# Patient Record
Sex: Male | Born: 1965 | Race: Black or African American | Marital: Married | Smoking: Former smoker
Health system: Southern US, Community
[De-identification: ages and names within clinical notes are randomized; demographics above are authoritative.]

## PROBLEM LIST (undated history)

## (undated) DIAGNOSIS — E119 Type 2 diabetes mellitus without complications: Secondary | ICD-10-CM

## (undated) DIAGNOSIS — E785 Hyperlipidemia, unspecified: Secondary | ICD-10-CM

## (undated) HISTORY — PX: CIRCUMCISION: SUR203

## (undated) HISTORY — PX: KIDNEY TRANSPLANT: SHX239

## (undated) HISTORY — DX: Hyperlipidemia, unspecified: E78.5

## (undated) HISTORY — DX: Type 2 diabetes mellitus without complications: E11.9

---

## 2017-12-31 DIAGNOSIS — E1165 Type 2 diabetes mellitus with hyperglycemia: Secondary | ICD-10-CM | POA: Diagnosis not present

## 2017-12-31 DIAGNOSIS — E1169 Type 2 diabetes mellitus with other specified complication: Secondary | ICD-10-CM | POA: Diagnosis not present

## 2018-01-15 DIAGNOSIS — E1169 Type 2 diabetes mellitus with other specified complication: Secondary | ICD-10-CM | POA: Diagnosis not present

## 2018-01-15 DIAGNOSIS — N529 Male erectile dysfunction, unspecified: Secondary | ICD-10-CM | POA: Diagnosis not present

## 2018-01-15 DIAGNOSIS — E785 Hyperlipidemia, unspecified: Secondary | ICD-10-CM | POA: Diagnosis not present

## 2018-01-15 DIAGNOSIS — E1165 Type 2 diabetes mellitus with hyperglycemia: Secondary | ICD-10-CM | POA: Diagnosis not present

## 2018-04-24 DIAGNOSIS — E1169 Type 2 diabetes mellitus with other specified complication: Secondary | ICD-10-CM | POA: Diagnosis not present

## 2018-04-24 DIAGNOSIS — E1165 Type 2 diabetes mellitus with hyperglycemia: Secondary | ICD-10-CM | POA: Diagnosis not present

## 2018-05-13 DIAGNOSIS — N182 Chronic kidney disease, stage 2 (mild): Secondary | ICD-10-CM | POA: Diagnosis not present

## 2018-05-13 DIAGNOSIS — E785 Hyperlipidemia, unspecified: Secondary | ICD-10-CM | POA: Diagnosis not present

## 2018-05-13 DIAGNOSIS — E1169 Type 2 diabetes mellitus with other specified complication: Secondary | ICD-10-CM | POA: Diagnosis not present

## 2018-05-13 DIAGNOSIS — Z125 Encounter for screening for malignant neoplasm of prostate: Secondary | ICD-10-CM | POA: Diagnosis not present

## 2018-05-13 DIAGNOSIS — E1165 Type 2 diabetes mellitus with hyperglycemia: Secondary | ICD-10-CM | POA: Diagnosis not present

## 2018-08-06 DIAGNOSIS — E1169 Type 2 diabetes mellitus with other specified complication: Secondary | ICD-10-CM | POA: Diagnosis not present

## 2018-08-06 DIAGNOSIS — E1165 Type 2 diabetes mellitus with hyperglycemia: Secondary | ICD-10-CM | POA: Diagnosis not present

## 2018-08-25 DIAGNOSIS — E1169 Type 2 diabetes mellitus with other specified complication: Secondary | ICD-10-CM | POA: Diagnosis not present

## 2018-08-25 DIAGNOSIS — Z6832 Body mass index (BMI) 32.0-32.9, adult: Secondary | ICD-10-CM | POA: Diagnosis not present

## 2018-08-25 DIAGNOSIS — E1165 Type 2 diabetes mellitus with hyperglycemia: Secondary | ICD-10-CM | POA: Diagnosis not present

## 2018-08-25 DIAGNOSIS — E785 Hyperlipidemia, unspecified: Secondary | ICD-10-CM | POA: Diagnosis not present

## 2018-11-21 DIAGNOSIS — E1165 Type 2 diabetes mellitus with hyperglycemia: Secondary | ICD-10-CM | POA: Diagnosis not present

## 2018-11-21 DIAGNOSIS — E1169 Type 2 diabetes mellitus with other specified complication: Secondary | ICD-10-CM | POA: Diagnosis not present

## 2018-11-26 DIAGNOSIS — E1165 Type 2 diabetes mellitus with hyperglycemia: Secondary | ICD-10-CM | POA: Diagnosis not present

## 2018-11-26 DIAGNOSIS — E1122 Type 2 diabetes mellitus with diabetic chronic kidney disease: Secondary | ICD-10-CM | POA: Diagnosis not present

## 2018-11-26 DIAGNOSIS — E785 Hyperlipidemia, unspecified: Secondary | ICD-10-CM | POA: Diagnosis not present

## 2018-11-26 DIAGNOSIS — E1169 Type 2 diabetes mellitus with other specified complication: Secondary | ICD-10-CM | POA: Diagnosis not present

## 2019-01-22 DIAGNOSIS — R531 Weakness: Secondary | ICD-10-CM | POA: Diagnosis not present

## 2019-01-22 DIAGNOSIS — W57XXXA Bitten or stung by nonvenomous insect and other nonvenomous arthropods, initial encounter: Secondary | ICD-10-CM | POA: Diagnosis not present

## 2019-01-22 DIAGNOSIS — Z7189 Other specified counseling: Secondary | ICD-10-CM | POA: Diagnosis not present

## 2019-03-12 DIAGNOSIS — R51 Headache: Secondary | ICD-10-CM | POA: Diagnosis not present

## 2019-03-12 DIAGNOSIS — Z20828 Contact with and (suspected) exposure to other viral communicable diseases: Secondary | ICD-10-CM | POA: Diagnosis not present

## 2019-03-12 DIAGNOSIS — R1084 Generalized abdominal pain: Secondary | ICD-10-CM | POA: Diagnosis not present

## 2019-03-23 DIAGNOSIS — Z125 Encounter for screening for malignant neoplasm of prostate: Secondary | ICD-10-CM | POA: Diagnosis not present

## 2019-03-23 DIAGNOSIS — E1169 Type 2 diabetes mellitus with other specified complication: Secondary | ICD-10-CM | POA: Diagnosis not present

## 2019-03-23 DIAGNOSIS — E1165 Type 2 diabetes mellitus with hyperglycemia: Secondary | ICD-10-CM | POA: Diagnosis not present

## 2019-04-01 DIAGNOSIS — E785 Hyperlipidemia, unspecified: Secondary | ICD-10-CM | POA: Diagnosis not present

## 2019-04-01 DIAGNOSIS — E1169 Type 2 diabetes mellitus with other specified complication: Secondary | ICD-10-CM | POA: Diagnosis not present

## 2019-04-01 DIAGNOSIS — E1122 Type 2 diabetes mellitus with diabetic chronic kidney disease: Secondary | ICD-10-CM | POA: Diagnosis not present

## 2019-04-01 DIAGNOSIS — E1129 Type 2 diabetes mellitus with other diabetic kidney complication: Secondary | ICD-10-CM | POA: Diagnosis not present

## 2019-07-25 DIAGNOSIS — R0981 Nasal congestion: Secondary | ICD-10-CM | POA: Diagnosis not present

## 2019-07-25 DIAGNOSIS — Z20828 Contact with and (suspected) exposure to other viral communicable diseases: Secondary | ICD-10-CM | POA: Diagnosis not present

## 2019-07-28 DIAGNOSIS — Z20828 Contact with and (suspected) exposure to other viral communicable diseases: Secondary | ICD-10-CM | POA: Diagnosis not present

## 2019-10-22 ENCOUNTER — Encounter: Payer: Self-pay | Admitting: Gastroenterology

## 2019-11-11 ENCOUNTER — Telehealth: Payer: Self-pay

## 2019-11-11 NOTE — Telephone Encounter (Signed)
Attempted to reach pt to confirm if he assess if pt has been seen by Dr. Lyndel Safe previously.    Unable to leave message on mobile-no other contacts.  Will need to assess prior or during PV of status.

## 2019-11-16 ENCOUNTER — Other Ambulatory Visit: Payer: Self-pay

## 2019-11-16 ENCOUNTER — Ambulatory Visit (AMBULATORY_SURGERY_CENTER): Payer: Self-pay

## 2019-11-16 DIAGNOSIS — Z01818 Encounter for other preprocedural examination: Secondary | ICD-10-CM

## 2019-11-16 DIAGNOSIS — Z1211 Encounter for screening for malignant neoplasm of colon: Secondary | ICD-10-CM

## 2019-11-16 NOTE — Progress Notes (Signed)
Patient's pre-visit was done today over the phone with the patient due to COVID-19 pandemic.   Name,DOB and address verified. Insurance verified. Packet of Prep instructions mailed to patient including copy of a consent form and pre-procedure patient acknowledgement form-pt is aware.   Patient understands to call us back with any questions or concerns. COVID-19 screening test is on 11/26/19 at 8:20, the patient is aware.  Pt is aware that care partner will wait in the car during procedure; if they feel like they will be too hot or cold to wait in the car; they may wait in the 4 th floor lobby.   Patient is aware to bring only one care partner. We want them to wear a mask (we do not have any that we can provide them), practice social distancing, and we will check their temperatures when they get here.  I did remind the patient that their care partner needs to stay in the parking lot the entire time and have a cell phone available, we will call them when the pt is ready for discharge. Patient will wear mask into building.

## 2019-11-26 ENCOUNTER — Other Ambulatory Visit: Payer: Self-pay | Admitting: Gastroenterology

## 2019-11-26 ENCOUNTER — Encounter: Payer: Self-pay | Admitting: Gastroenterology

## 2019-11-26 ENCOUNTER — Ambulatory Visit (INDEPENDENT_AMBULATORY_CARE_PROVIDER_SITE_OTHER): Payer: 59

## 2019-11-26 DIAGNOSIS — Z1159 Encounter for screening for other viral diseases: Secondary | ICD-10-CM

## 2019-11-27 LAB — SARS CORONAVIRUS 2 (TAT 6-24 HRS): SARS Coronavirus 2: NEGATIVE

## 2019-11-30 ENCOUNTER — Ambulatory Visit (AMBULATORY_SURGERY_CENTER): Payer: 59 | Admitting: Gastroenterology

## 2019-11-30 ENCOUNTER — Other Ambulatory Visit: Payer: Self-pay

## 2019-11-30 ENCOUNTER — Encounter: Payer: Self-pay | Admitting: Gastroenterology

## 2019-11-30 VITALS — BP 122/85 | HR 77 | Temp 96.8°F | Resp 17 | Ht 72.0 in | Wt 215.0 lb

## 2019-11-30 DIAGNOSIS — D122 Benign neoplasm of ascending colon: Secondary | ICD-10-CM

## 2019-11-30 DIAGNOSIS — Z1211 Encounter for screening for malignant neoplasm of colon: Secondary | ICD-10-CM

## 2019-11-30 MED ORDER — SODIUM CHLORIDE 0.9 % IV SOLN: 500.0000 mL | Freq: Once | INTRAVENOUS | Status: DC

## 2019-11-30 NOTE — Progress Notes (Signed)
Vs by KA. Temp by Hiram Comber

## 2019-11-30 NOTE — Patient Instructions (Signed)
HANDOUTS PROVIDED ON: POLYPS  The polyp removed today have been sent for pathology.  The results can take 1-3 weeks to receive.  When your next colonoscopy should occur will be based on the pathology results.    You may resume your previous diet and medication schedule.  Thank you for allowing us to care for you today!!!   YOU HAD AN ENDOSCOPIC PROCEDURE TODAY AT THE [facility]:   Refer to the procedure report that was given to you for any specific questions about what was found during the examination.  If the procedure report does not answer your questions, please call your gastroenterologist to clarify.  If you requested that your care partner not be given the details of your procedure findings, then the procedure report has been included in a sealed envelope for you to review at your convenience later.  YOU SHOULD EXPECT: Some feelings of bloating in the abdomen. Passage of more gas than usual.  Walking can help get rid of the air that was put into your GI tract during the procedure and reduce the bloating. If you had a lower endoscopy (such as a colonoscopy or flexible sigmoidoscopy) you may notice spotting of blood in your stool or on the toilet paper. If you underwent a bowel prep for your procedure, you may not have a normal bowel movement for a few days.  Please Note:  You might notice some irritation and congestion in your nose or some drainage.  This is from the oxygen used during your procedure.  There is no need for concern and it should clear up in a day or so.  SYMPTOMS TO REPORT IMMEDIATELY:   Following lower endoscopy (colonoscopy or flexible sigmoidoscopy):  Excessive amounts of blood in the stool  Significant tenderness or worsening of abdominal pains  Swelling of the abdomen that is new, acute  Fever of 100F or higher  For urgent or emergent issues, a gastroenterologist can be reached at any hour by calling [phone]. Do not use MyChart messaging for  urgent concerns.    DIET:  We do recommend a small meal at first, but then you may proceed to your regular diet.  Drink plenty of fluids but you should avoid alcoholic beverages for 24 hours.  ACTIVITY:  You should plan to take it easy for the rest of today and you should NOT DRIVE or use heavy machinery until tomorrow (because of the sedation medicines used during the test).    FOLLOW UP: Our staff will call the number listed on your records 48-72 hours following your procedure to check on you and address any questions or concerns that you may have regarding the information given to you following your procedure. If we do not reach you, we will leave a message.  We will attempt to reach you two times.  During this call, we will ask if you have developed any symptoms of COVID 19. If you develop any symptoms (ie: fever, flu-like symptoms, shortness of breath, cough etc.) before then, please call [phone].  If you test positive for Covid 19 in the 2 weeks post procedure, please call and report this information to us.    If any biopsies were taken you will be contacted by phone or by letter within the next 1-3 weeks.  Please call us at [phone] if you have not heard about the biopsies in 3 weeks.    SIGNATURES/CONFIDENTIALITY: You and/or your care partner have signed paperwork which will be entered into   your electronic medical record.  These signatures attest to the fact that that the information above on your After Visit Summary has been reviewed and is understood.  Full responsibility of the confidentiality of this discharge information lies with you and/or your care-partner.

## 2019-11-30 NOTE — Op Note (Signed)
[facility] Patient Name: Jonathan Spears Procedure Date: 11/30/2019 8:27 AM MRN: [id number] Endoscopist: Mallie Mussel L. Loletha Carrow , MD Age: 54 Referring MD:  Date of Birth: 16-Sep-1965 Gender: Male Account #: [id number] Procedure:                Colonoscopy Indications:              Screening for colorectal malignant neoplasm, This                            is the patient's first colonoscopy Medicines:                Monitored Anesthesia Care Procedure:                Pre-Anesthesia Assessment:                           - Prior to the procedure, a History and Physical                            was performed, and patient medications and                            allergies were reviewed. The patient's tolerance of                            previous anesthesia was also reviewed. The risks                            and benefits of the procedure and the sedation                            options and risks were discussed with the patient.                            All questions were answered, and informed consent                            was obtained. Prior Anticoagulants: The patient has                            taken no previous anticoagulant or antiplatelet                            agents except for aspirin. ASA Grade Assessment: II                            - A patient with mild systemic disease. After                            reviewing the risks and benefits, the patient was                            deemed in satisfactory condition to undergo the  procedure.                           After obtaining informed consent, the colonoscope                            was passed under direct vision. Throughout the                            procedure, the patient's blood pressure, pulse, and                            oxygen saturations were monitored continuously. The                            Colonoscope was introduced through the anus and                       advanced to the the cecum, identified by                            appendiceal orifice and ileocecal valve. The                            colonoscopy was performed without difficulty. The                            patient tolerated the procedure well. The quality                            of the bowel preparation was initially fair, then                            improved to good with extensive lavage. The                            ileocecal valve, appendiceal orifice, and rectum                            were photographed. The bowel preparation used was                            Miralax. Scope In: 8:36:56 AM Scope Out: 9:00:09 AM Scope Withdrawal Time: 0 hours 15 minutes 53 seconds  Total Procedure Duration: 0 hours 23 minutes 13 seconds  Findings:                 The perianal and digital rectal examinations were                            normal.                           A 4 mm polyp was found in the ascending colon. The  polyp was sessile. The polyp was removed with a                            cold snare. Resection and retrieval were complete.                            To stop active bleeding, one hemostatic clip was                            successfully placed. There was no bleeding at the                            end of the procedure.                           The exam was otherwise without abnormality on                            direct and retroflexion views. Complications:            No immediate complications. Estimated Blood Loss:     Estimated blood loss was minimal. Impression:               - One 4 mm polyp in the ascending colon, removed                            with a cold snare. Resected and retrieved. Clip was                            placed.                           - The examination was otherwise normal on direct                            and retroflexion views. Recommendation:           - Patient has  a contact number available for                            emergencies. The signs and symptoms of potential                            delayed complications were discussed with the                            patient. Return to normal activities tomorrow.                            Written discharge instructions were provided to the                            patient.                           - Resume previous diet.                           -  Continue present medications.                           - Repeat colonoscopy in 3 years for surveillance                            due to prep quality. (Suprep or Plenvu for next                            exam) Carlin Attridge L. Loletha Carrow, MD 11/30/2019 9:05:06 AM This report has been signed electronically.

## 2019-11-30 NOTE — Progress Notes (Signed)
To PACU, VSS. Report to [email or web address] 

## 2019-11-30 NOTE — Progress Notes (Signed)
Called to room to assist during endoscopic procedure.  Patient ID and intended procedure confirmed with present staff. Received instructions for my participation in the procedure from the performing physician.  

## 2019-12-02 ENCOUNTER — Telehealth: Payer: Self-pay | Admitting: *Deleted

## 2019-12-02 NOTE — Telephone Encounter (Signed)
1. Have you developed a fever since your procedure? no  2.   Have you had an respiratory symptoms (SOB or cough) since your procedure? no  3.   Have you tested positive for COVID 19 since your procedure no  4.   Have you had any family members/close contacts diagnosed with the COVID 19 since your procedure?  no   If yes to any of these questions please route to Joylene John, RN and Erenest Rasher, RN Follow up Call-  Call back number 11/30/2019  Post procedure Call Back phone  # [phone]  Permission to leave phone message Yes     Patient questions:  Do you have a fever, pain , or abdominal swelling? No. Pain Score  0 *  Have you tolerated food without any problems? Yes.    Have you been able to return to your normal activities? Yes.    Do you have any questions about your discharge instructions: Diet   No. Medications  No. Follow up visit  no  Do you have questions or concerns about your Care? No.  Actions: * If pain score is 4 or above: No action needed, pain <4.

## 2019-12-02 NOTE — Telephone Encounter (Signed)
  Follow up Call-  Call back number 11/30/2019  Post procedure Call Back phone  # [phone]  Permission to leave phone message Yes     Patient questions:  Do you have a fever, pain , or abdominal swelling? No. Pain Score  0 *  Have you tolerated food without any problems? Yes.    Have you been able to return to your normal activities? Yes.    Do you have any questions about your discharge instructions: Diet   No. Medications  No. Follow up visit  No.  Do you have questions or concerns about your Care? No.  Actions: * If pain score is 4 or above: No action needed, pain <4.  1. Have you developed a fever since your procedure? no  2.   Have you had an respiratory symptoms (SOB or cough) since your procedure? no  3.   Have you tested positive for COVID 19 since your procedure no  4.   Have you had any family members/close contacts diagnosed with the COVID 19 since your procedure?  no   If yes to any of these questions please route to Joylene John, RN and Erenest Rasher, RN

## 2019-12-04 ENCOUNTER — Encounter: Payer: Self-pay | Admitting: Gastroenterology

## 2021-10-26 ENCOUNTER — Other Ambulatory Visit: Payer: Self-pay | Admitting: Family Medicine

## 2021-10-26 ENCOUNTER — Other Ambulatory Visit (HOSPITAL_BASED_OUTPATIENT_CLINIC_OR_DEPARTMENT_OTHER): Payer: Self-pay | Admitting: Family Medicine

## 2021-10-26 DIAGNOSIS — N1831 Chronic kidney disease, stage 3a: Secondary | ICD-10-CM

## 2021-10-27 ENCOUNTER — Telehealth (HOSPITAL_BASED_OUTPATIENT_CLINIC_OR_DEPARTMENT_OTHER): Payer: Self-pay

## 2021-11-06 ENCOUNTER — Ambulatory Visit (HOSPITAL_BASED_OUTPATIENT_CLINIC_OR_DEPARTMENT_OTHER): Payer: 59

## 2021-11-10 ENCOUNTER — Ambulatory Visit (HOSPITAL_BASED_OUTPATIENT_CLINIC_OR_DEPARTMENT_OTHER)
Admission: RE | Admit: 2021-11-10 | Discharge: 2021-11-10 | Disposition: A | Discharge status: Home / Self Care | Payer: 59 | Source: Ambulatory Visit | Attending: Family Medicine | Admitting: Family Medicine

## 2021-11-10 DIAGNOSIS — N1831 Chronic kidney disease, stage 3a: Secondary | ICD-10-CM | POA: Diagnosis present

## 2021-11-14 ENCOUNTER — Other Ambulatory Visit (HOSPITAL_BASED_OUTPATIENT_CLINIC_OR_DEPARTMENT_OTHER): Payer: Self-pay | Admitting: Family Medicine

## 2021-11-14 ENCOUNTER — Encounter: Payer: Self-pay | Admitting: *Deleted

## 2021-11-14 DIAGNOSIS — N2889 Other specified disorders of kidney and ureter: Secondary | ICD-10-CM

## 2021-11-15 ENCOUNTER — Other Ambulatory Visit (HOSPITAL_BASED_OUTPATIENT_CLINIC_OR_DEPARTMENT_OTHER): Payer: Self-pay | Admitting: Family Medicine

## 2021-11-15 DIAGNOSIS — N2889 Other specified disorders of kidney and ureter: Secondary | ICD-10-CM

## 2021-11-17 ENCOUNTER — Ambulatory Visit (HOSPITAL_BASED_OUTPATIENT_CLINIC_OR_DEPARTMENT_OTHER)
Admission: RE | Admit: 2021-11-17 | Discharge: 2021-11-17 | Disposition: A | Discharge status: Home / Self Care | Payer: 59 | Source: Ambulatory Visit | Attending: Family Medicine | Admitting: Family Medicine

## 2021-11-17 ENCOUNTER — Encounter (HOSPITAL_BASED_OUTPATIENT_CLINIC_OR_DEPARTMENT_OTHER): Payer: Self-pay

## 2021-11-17 DIAGNOSIS — N2889 Other specified disorders of kidney and ureter: Secondary | ICD-10-CM | POA: Insufficient documentation

## 2021-11-17 MED ORDER — IOHEXOL 300 MG/ML  SOLN
100.0000 mL | Freq: Once | INTRAMUSCULAR | Status: AC | PRN
  Administered 2021-11-17: 100 mL via INTRAVENOUS

## 2021-11-21 ENCOUNTER — Ambulatory Visit: Payer: 59 | Admitting: Urology

## 2021-11-21 ENCOUNTER — Encounter: Payer: Self-pay | Admitting: Urology

## 2021-11-21 VITALS — BP 151/92 | HR 75 | Ht 71.5 in | Wt 227.0 lb

## 2021-11-21 DIAGNOSIS — N1831 Chronic kidney disease, stage 3a: Secondary | ICD-10-CM

## 2021-11-21 DIAGNOSIS — Z905 Acquired absence of kidney: Secondary | ICD-10-CM

## 2021-11-21 DIAGNOSIS — N2889 Other specified disorders of kidney and ureter: Secondary | ICD-10-CM

## 2021-11-21 NOTE — Patient Instructions (Signed)
Renal Mass ? ?A renal mass is an abnormal growth in the kidney. It may be found while performing an MRI, CT scan, or ultrasound to evaluate other problems of the abdomen. A renal mass that is cancerous (malignant) may grow or spread quickly. Others are not cancerous (benign). ?Renal masses include: ?Tumors. These may be malignant or benign. ?The most common type of kidney cancer in adults is renal cell carcinoma. In children, the most common type of kidney cancer is Wilms tumor. ?The most common benign tumors of the kidney include renal adenomas, oncocytomas, and angiomyolipoma (AML). ?Cysts. These are fluid-filled sacs that form on or in the kidney. ?What are the causes? ?Certain types of cancers, infections, or injuries can cause a renal mass. It is not always known what causes a cyst to develop in or on the kidney. ?What are the signs or symptoms? ?Often, a renal mass does not cause any signs or symptoms; most kidney cysts do not cause symptoms. ?How is this diagnosed? ?Your health care provider may recommend tests to diagnose the cause of your renal mass. These tests may be done if a renal mass is found: ?Physical exam. ?Blood tests. ?Urine tests. ?Imaging tests, such as ultrasound, CT scan, or MRI. ?Biopsy. This is a small sample that is removed from the renal mass and tested in a lab. ?The exact tests and how often they are done will depend on: ?The size and appearance of the renal mass. ?Risk factors or medical conditions that increase your risk for problems. ?Any symptoms associated with the renal mass, or concerns that you have about it. ?Tests and physical exams may be done once, or they may be done regularly for a period of time. Tests and exams that are done regularly will help monitor whether the mass is growing and beginning to cause problems. ?How is this treated? ?Treatment is not always needed for this condition. Your health care provider may recommend careful monitoring and regular tests and exams.  Treatment will depend on the cause of the mass. ?Treatment for a cancerous renal mass may include surgical removal, chemotherapy, radiation, or immunotherapy. ?Most kidney cysts do not need to be treated. ?Follow these instructions at home: ?What you need to do at home will depend on the cause of the mass. Follow the instructions that your health care provider gives to you. In general: ?Take over-the-counter and prescription medicines only as told by your health care provider. ?If you were prescribed an antibiotic medicine, take it as told by your health care provider. Do not stop taking the antibiotic even if you start to feel better. ?Follow any restrictions that are given to you by your health care provider. ?Keep all follow-up visits. This is important. ?You may need to see your health care provider once or twice a year to have CT scans and ultrasounds. These tests will show if your renal mass has changed or grown. ?Contact a health care provider if you: ?Have pain in your side or back (flank pain). ?Have a fever. ?Feel full soon after eating. ?Have pain or swelling in the abdomen. ?Lose weight. ?Get help right away if: ?Your pain gets worse. ?There is blood in your urine. ?You cannot urinate. ?You have chest pain. ?You have trouble breathing. ?These symptoms may represent a serious problem that is an emergency. Do not wait to see if the symptoms will go away. Get medical help right away. Call your local emergency services (911 in the [place] ?Summary ?A renal mass is an  abnormal growth in the kidney. It may be cancerous (malignant) and grow or spread quickly, or it may not be cancerous (benign). Renal masses often do not have any signs or symptoms. ?Renal masses may be found while performing an MRI, CT scan, or ultrasound for other problems of the abdomen. ?Your health care provider may recommend that you have tests to diagnose the cause of your renal mass. These may include a physical exam, blood tests, urine  tests, imaging, or a biopsy. ?Treatment is not always needed for this condition. Careful monitoring may be recommended. ?This information is not intended to replace advice given to you by your health care provider. Make sure you discuss any questions you have with your health care provider. ?Document Revised: 01/11/2020 Document Reviewed: 01/11/2020 ?Elsevier Patient Education ? [employer]. ? ?Minimally Invasive Nephrectomy ?Minimally invasive nephrectomy is a surgical procedure to remove a kidney. This procedure is done through several small incisions in the abdomen. You may have surgery to: ?Remove the entire kidney and some surrounding structures (radical nephrectomy). ?Remove only the damaged or diseased part of the kidney (partial nephrectomy). ?You may need this surgery if: ?Your kidney is severely damaged from disease, infection, or cancer. ?You were born with an abnormal kidney. ?You are donating a healthy kidney. ?Tell a health care provider about: ?Any allergies you have. ?All medicines you are taking, including vitamins, herbs, eye drops, creams, and over-the-counter medicines. ?Any problems you or family members have had with anesthetic medicines. ?Any blood disorders you have. ?Any surgeries you have had. ?Any medical conditions you have. ?Whether you are pregnant or may be pregnant. ?What are the risks? ?Generally, this is a safe procedure. However, problems may occur, including: ?Bleeding. ?Infection. ?Damage to other body structures near the kidney. ?Urine leaking into the abdomen. ?Pneumonia. ?A blood clot that forms in the leg and travels to the lung (pulmonary embolism). ?Allergic reactions to medicines. ?A bone (usually part of a rib) or more tissue may need to be removed so the surgeon can get to the kidney. If this happens, the minimally invasive procedure may need to be changed to an open procedure. ?What happens before the procedure? ?Staying hydrated ?Follow instructions from your  health care provider about hydration, which may include: ?Up to 2 hours before the procedure - you may continue to drink clear liquids, such as water, clear fruit juice, black coffee, and plain tea. ? ?Eating and drinking restrictions ?Follow instructions from your health care provider about eating and drinking, which may include: ?8 hours before the procedure - stop eating heavy meals or foods, such as meat, fried foods, or fatty foods. ?6 hours before the procedure - stop eating light meals or foods, such as toast or cereal. ?6 hours before the procedure - stop drinking milk or drinks that contain milk. ?2 hours before the procedure - stop drinking clear liquids. ?Medicines ?Ask your health care provider about: ?Changing or stopping your regular medicines. This is especially important if you are taking diabetes medicines or blood thinners. ?Taking medicines such as aspirin and ibuprofen. These medicines can thin your blood. Do not take these medicines unless your health care provider tells you to take them. ?Taking over-the-counter medicines, vitamins, herbs, and supplements. ?Surgery safety ?Ask your health care provider: ?How your surgery site will be marked. ?What steps will be taken to help prevent infection. These steps may include: ?Removing hair at the surgery site. ?Washing skin with a germ-killing soap. ?Taking antibiotic medicine. ?General instructions ?Do  not use any products that contain nicotine or tobacco for at least 4 weeks before the procedure. These products include cigarettes, e-cigarettes, and chewing tobacco. If you need help quitting, ask your health care provider. ?Your health care provider may instruct you to do breathing exercises before the procedure to help prevent pneumonia. Do these exercises as directed. ?You may need to have your blood drawn (type and cross) in case you need to receive blood (get a transfusion) during the procedure. ?Plan to have a responsible adult take you home  from the hospital or clinic. ?Plan to have a responsible adult care for you for the time you are told after you leave the hospital or clinic. This is important. ?What happens during the procedure? ? ?  ? ?B

## 2021-11-21 NOTE — Progress Notes (Signed)
? ?11/21/21 ?1:14 PM  ? ?[place] ?04/21/1966 ?[id number] ? ?CC: Right renal mass, solitary kidney, CKD ? ?HPI: ?56 year old male with a solitary right kidney who was recently found to have a 7 cm right upper pole enhancing renal mass on CT.  He donated a kidney to his son in 30, and underwent a open left radical nephrectomy at that time.  He had had some changes in his creatinine recently which prompted a renal ultrasound showing a right upper pole mass, resulting in the CT abdomen and pelvis with contrast showing the 7 cm enhancing right upper pole renal mass adjacent to the liver.  Most recent renal function shows a creatinine of 1.54, EGFR 52, stage IIIa CKD.  He denies any gross hematuria or flank pain.  No chest imaging to review. ? ? ?PMH: ?Past Medical History:  ?Diagnosis Date  ? Diabetes mellitus without complication ([place])   ? Hyperlipidemia   ? ? ?Surgical History: ?Past Surgical History:  ?Procedure Laterality Date  ? CIRCUMCISION    ? KIDNEY TRANSPLANT    ? Gave kidney to son.  ? ? ? ?Family History: ?Family History  ?Problem Relation Age of Onset  ? Colon polyps Neg Hx   ? Crohn's disease Neg Hx   ? Esophageal cancer Neg Hx   ? Rectal cancer Neg Hx   ? Stomach cancer Neg Hx   ? ? ?Social History:  reports that he has quit smoking. He has been exposed to tobacco smoke. He has quit using smokeless tobacco. He reports that he does not currently use alcohol. He reports that he does not use drugs. ? ?Physical Exam: ?BP (!) 151/92   Pulse 75   Ht 5' 11.5" (1.816 m)   Wt 227 lb (103 kg)   BMI 31.22 kg/m?   ? ?Constitutional:  Alert and oriented, No acute distress. ?Cardiovascular: No clubbing, cyanosis, or edema. ?Respiratory: Normal respiratory effort, no increased work of breathing. ?GI: Abdomen is soft, nontender, nondistended, no abdominal masses ?GU: Large left flank incision well-healed ? ?Laboratory Data: ?Reviewed, see HPI ? ?Pertinent Imaging: ?I have personally viewed and interpreted  the CT abdomen pelvis with contrast showing a large enhancing 7 cm right upper pole renal mass consistent with RCC, adjacent to the liver but no definite invasion, no obvious evidence of metastatic disease. ? ?Assessment & Plan:   ?56 year old male with solitary right kidney recently found to have a 7 cm right upper pole enhancing mass consistent with renal cell carcinoma most recent EGFR 52 consistent with CKD stage IIIa.  Has not had chest imaging yet to complete staging. ? ?I had a very frank conversation with the patient today about his likely diagnosis of renal cell carcinoma and a solitary kidney, and that partial nephrectomy likely would be his best option to prevent dialysis.  I think that this mass would be amenable to partial nephrectomy at a tertiary care center, and recommended [facility] for further evaluation and consideration of a minimally invasive versus open right partial nephrectomy.  We also discussed the role of potentially adjuvant treatments and immunotherapy in RCC.  We also discussed potential for renal mass biopsy.  Finally, we discussed the need for chest CT to rule out any metastatic disease prior to finalizing treatment strategy. ? ?-Referral placed to [facility] for 7 cm enhancing right renal mass consistent with RCC and a right solitary kidney for consideration of partial nephrectomy ? ?I spent 65 total minutes on  the day of the encounter including pre-visit review of the medical record, face-to-face time with the patient, and post visit ordering of labs/imaging/tests. ? ?Nickolas Madrid, MD ?11/21/2021 ? ?[facility] [place] ?[address] ?[place], [place] [address] ?[phone] ? ? ?

## 2021-12-19 DIAGNOSIS — N400 Enlarged prostate without lower urinary tract symptoms: Secondary | ICD-10-CM | POA: Insufficient documentation

## 2021-12-27 ENCOUNTER — Other Ambulatory Visit (HOSPITAL_BASED_OUTPATIENT_CLINIC_OR_DEPARTMENT_OTHER): Payer: Self-pay | Admitting: Urology

## 2021-12-27 DIAGNOSIS — N2889 Other specified disorders of kidney and ureter: Secondary | ICD-10-CM

## 2021-12-28 ENCOUNTER — Other Ambulatory Visit (HOSPITAL_BASED_OUTPATIENT_CLINIC_OR_DEPARTMENT_OTHER): Payer: Self-pay | Admitting: Urology

## 2021-12-28 DIAGNOSIS — I10 Essential (primary) hypertension: Secondary | ICD-10-CM | POA: Insufficient documentation

## 2021-12-28 DIAGNOSIS — N2889 Other specified disorders of kidney and ureter: Secondary | ICD-10-CM

## 2022-01-06 ENCOUNTER — Ambulatory Visit (HOSPITAL_BASED_OUTPATIENT_CLINIC_OR_DEPARTMENT_OTHER)
Admission: RE | Admit: 2022-01-06 | Discharge: 2022-01-06 | Disposition: A | Discharge status: Home / Self Care | Payer: BC Managed Care – PPO | Source: Ambulatory Visit | Attending: Urology | Admitting: Urology

## 2022-01-06 DIAGNOSIS — N2889 Other specified disorders of kidney and ureter: Secondary | ICD-10-CM

## 2022-01-06 MED ORDER — GADOBUTROL 1 MMOL/ML IV SOLN
10.0000 mL | Freq: Once | INTRAVENOUS | Status: AC | PRN
  Administered 2022-01-06: 10 mL via INTRAVENOUS

## 2022-01-06 MED ORDER — IOHEXOL 300 MG/ML  SOLN
100.0000 mL | Freq: Once | INTRAMUSCULAR | Status: AC | PRN
  Administered 2022-01-06: 75 mL via INTRAVENOUS

## 2022-03-30 DIAGNOSIS — R509 Fever, unspecified: Secondary | ICD-10-CM | POA: Insufficient documentation

## 2022-03-31 DIAGNOSIS — J90 Pleural effusion, not elsewhere classified: Secondary | ICD-10-CM | POA: Insufficient documentation

## 2022-03-31 DIAGNOSIS — I82621 Acute embolism and thrombosis of deep veins of right upper extremity: Secondary | ICD-10-CM | POA: Insufficient documentation

## 2022-05-06 DIAGNOSIS — T8143XA Infection following a procedure, organ and space surgical site, initial encounter: Secondary | ICD-10-CM | POA: Insufficient documentation

## 2022-05-06 DIAGNOSIS — A4151 Sepsis due to Escherichia coli [E. coli]: Secondary | ICD-10-CM | POA: Insufficient documentation

## 2022-05-06 DIAGNOSIS — K651 Peritoneal abscess: Secondary | ICD-10-CM | POA: Insufficient documentation

## 2022-06-30 DIAGNOSIS — R109 Unspecified abdominal pain: Secondary | ICD-10-CM | POA: Insufficient documentation

## 2022-08-17 DIAGNOSIS — D649 Anemia, unspecified: Secondary | ICD-10-CM | POA: Insufficient documentation

## 2022-08-17 DIAGNOSIS — Z8639 Personal history of other endocrine, nutritional and metabolic disease: Secondary | ICD-10-CM | POA: Insufficient documentation

## 2022-08-17 DIAGNOSIS — N182 Chronic kidney disease, stage 2 (mild): Secondary | ICD-10-CM | POA: Insufficient documentation

## 2022-11-07 ENCOUNTER — Encounter: Payer: Self-pay | Admitting: Gastroenterology

## 2022-12-07 ENCOUNTER — Other Ambulatory Visit (HOSPITAL_BASED_OUTPATIENT_CLINIC_OR_DEPARTMENT_OTHER): Payer: Self-pay

## 2022-12-07 MED ORDER — MOUNJARO 5 MG/0.5ML ~~LOC~~ SOAJ
5.0000 mg | SUBCUTANEOUS | 2 refills | Status: DC
  Filled 2022-12-07: qty 2, 28d supply, fill #0

## 2022-12-10 ENCOUNTER — Other Ambulatory Visit (HOSPITAL_BASED_OUTPATIENT_CLINIC_OR_DEPARTMENT_OTHER): Payer: Self-pay

## 2022-12-13 ENCOUNTER — Other Ambulatory Visit (HOSPITAL_BASED_OUTPATIENT_CLINIC_OR_DEPARTMENT_OTHER): Payer: Self-pay

## 2023-05-13 IMAGING — CT CT ABD-PEL WO/W CM
2 of 11 series · 12 of 46 positions shown, 15 images · IV contrast (Omnipaque)
Comparison: Renal ultrasound 11/10/2021

CLINICAL DATA: Renal mass right kidney. Left oophorectomy for organ
donation.

EXAM:
CT ABDOMEN AND PELVIS WITHOUT AND WITH CONTRAST
TECHNIQUE: Multidetector CT imaging of the abdomen and pelvis was performed
following the standard protocol before and following the bolus
administration of intravenous contrast.

[Series 5: axial nephro · axial · 0.97mm/px · z∈[-534,-66]mm · 10 of 182 slices shown, 13 images]
[im 13/182  soft-tissue]
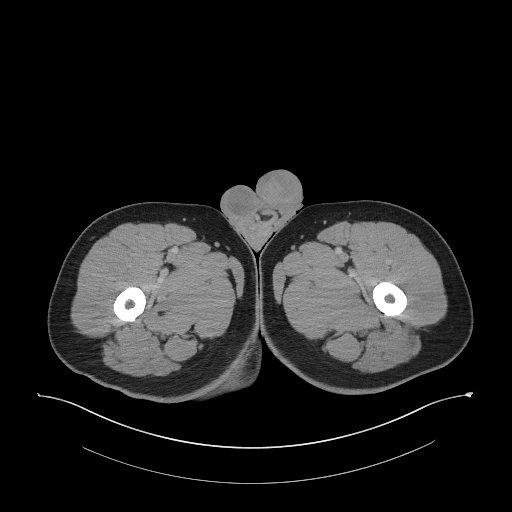
[im 13/182  bone]
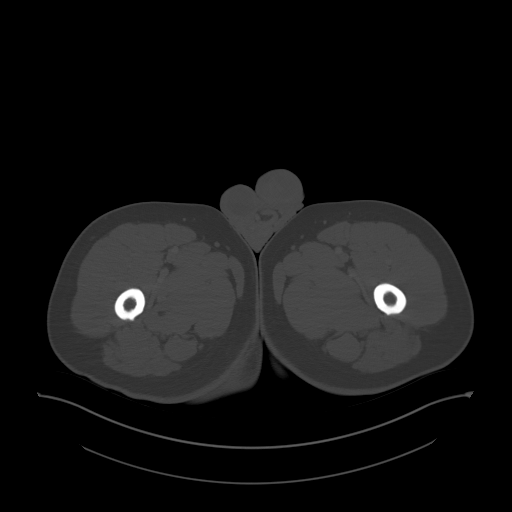
[im 37/182  soft-tissue]
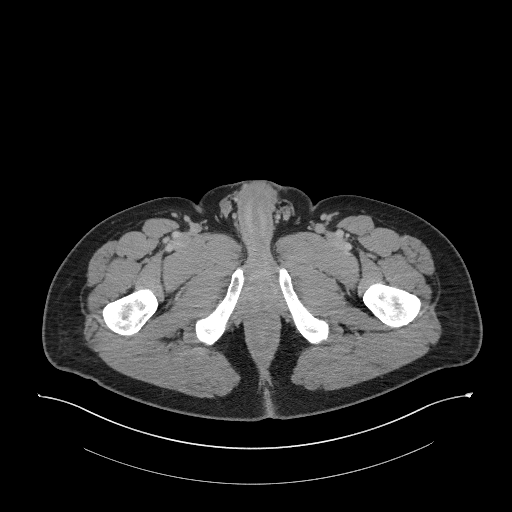
[im 61/182  soft-tissue]
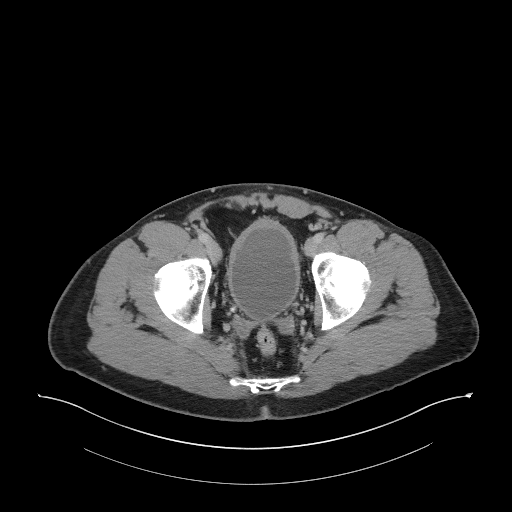
[im 85/182  soft-tissue]
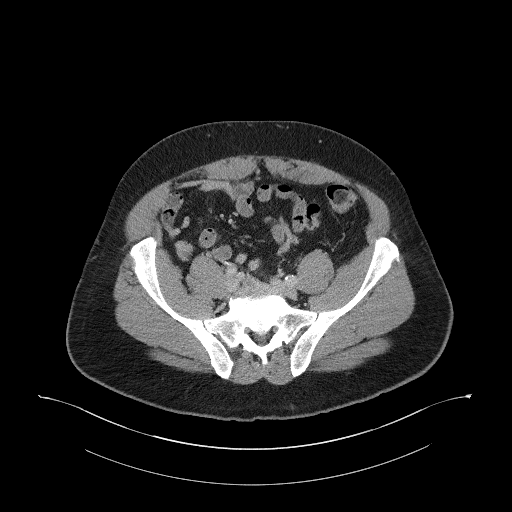
[im 97/182  soft-tissue]
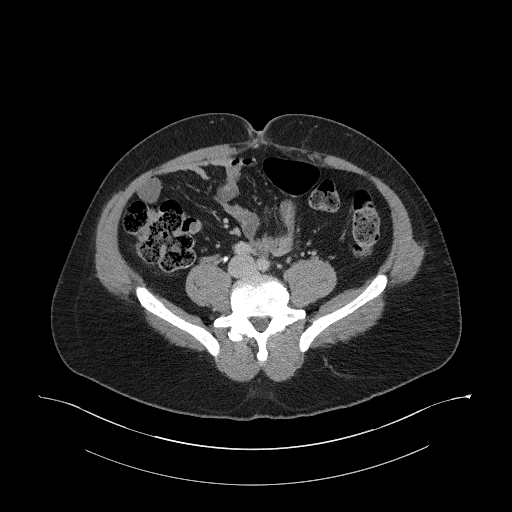
[im 121/182  soft-tissue]
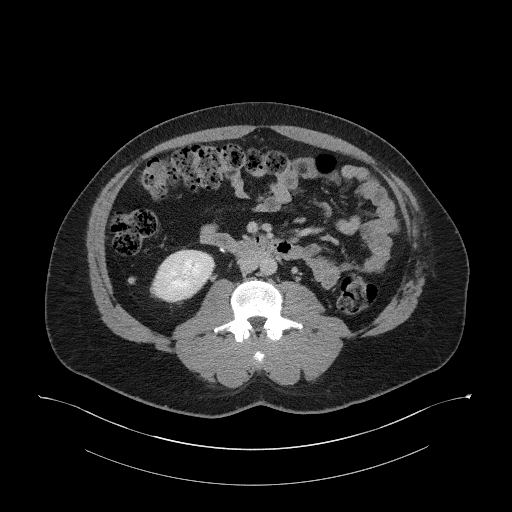
[im 133/182  lung]
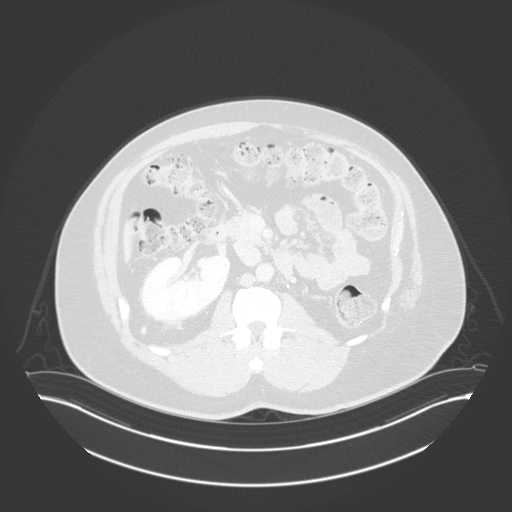
[im 145/182  soft-tissue]
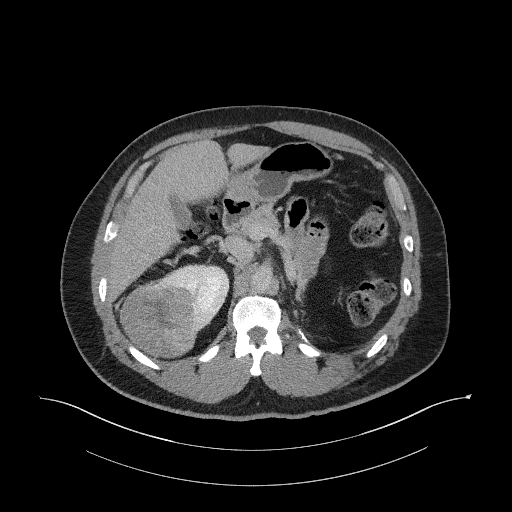
[im 145/182  lung]
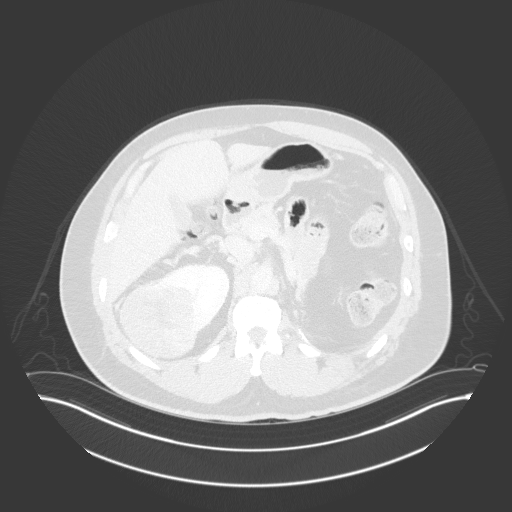
[im 157/182  lung]
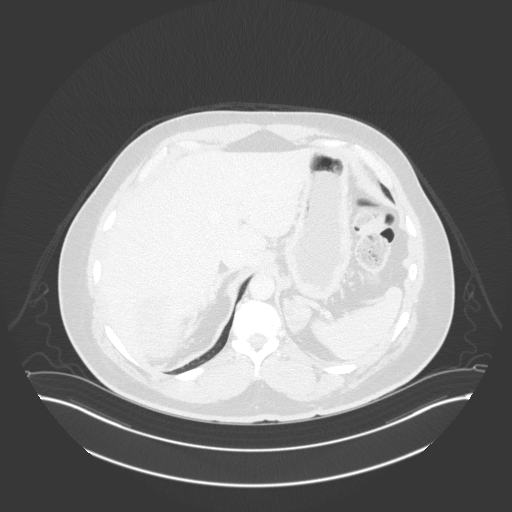
[im 169/182  soft-tissue]
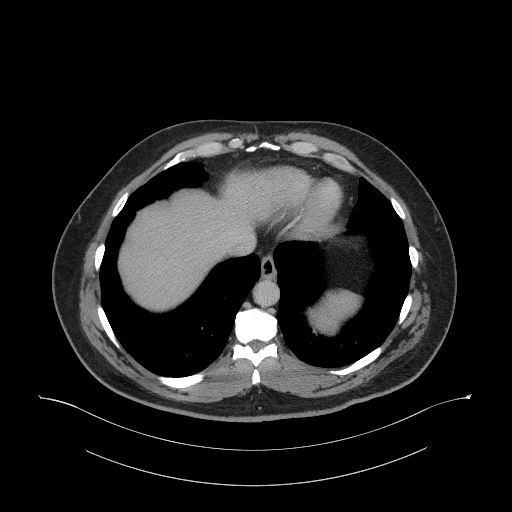
[im 169/182  lung]
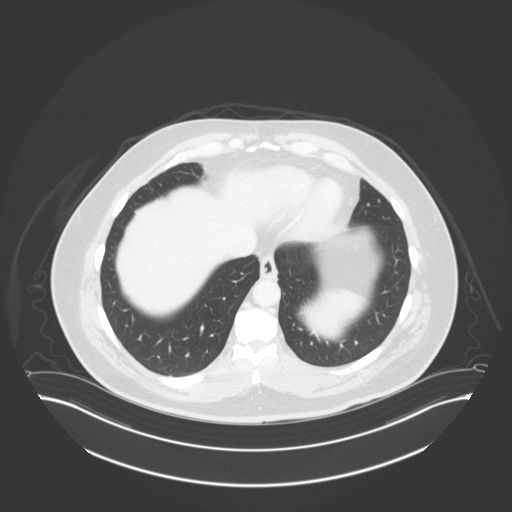

[Series 9: coronal pre · coronal · non-contrast · 0.55mm/px · 2 of 105 slices shown]
[im 35/105  soft-tissue]
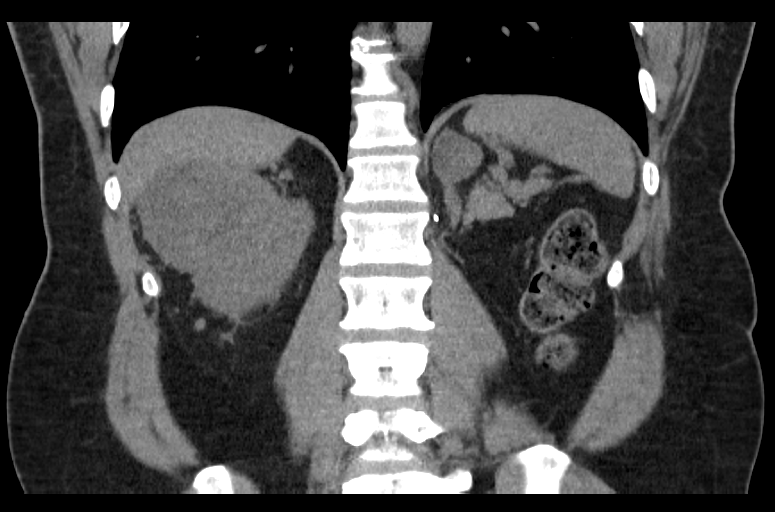
[im 70/105  soft-tissue]
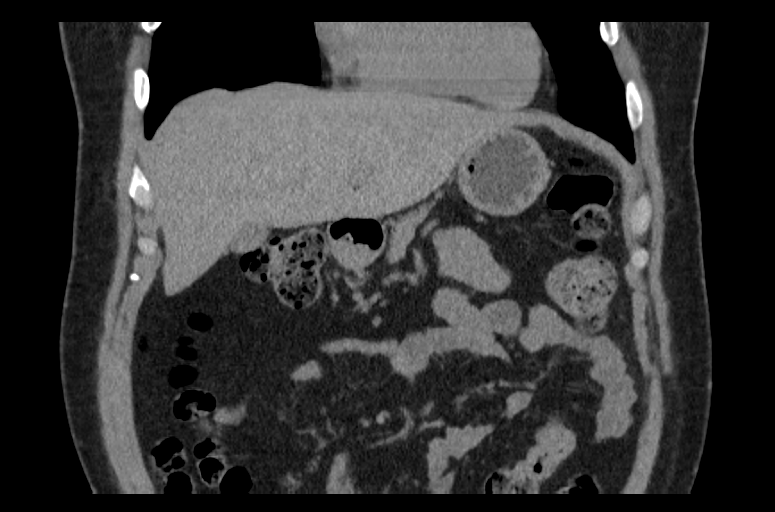

[12 of 46 positions shown; findings below may reference images not displayed]

RADIATION DOSE REDUCTION: This exam was performed according to the
departmental dose-optimization program which includes automated
exposure control, adjustment of the mA and/or kV according to
patient size and/or use of iterative reconstruction technique.

CONTRAST:  100mL OMNIPAQUE IOHEXOL 300 MG/ML  SOLN
FINDINGS: Lower chest: Unremarkable.

Hepatobiliary: No suspicious focal abnormality within the liver
parenchyma. There is no evidence for gallstones, gallbladder wall
thickening, or pericholecystic fluid. No intrahepatic or
extrahepatic biliary dilation.

Pancreas: No focal mass lesion. No dilatation of the main duct. No
intraparenchymal cyst. No peripancreatic edema.

Spleen: No splenomegaly. No focal mass lesion.

Adrenals/Urinary Tract: No evidence for right adrenal mass. 3.2 x
2.6 cm left adrenal nodule has average attenuation of 13 Hounsfield
units on precontrast imaging, 52 Hounsfield units on portal venous
phase imaging and 46 Hounsfield units on delayed imaging. This
nodule does not meet criteria for classification as adenoma based on
precontrast imaging, absolute washout percentage for relative
washout percentage.

Left kidney surgically absent.

8.4 x 7.2 x 5.9 cm heterogeneously enhancing mass is identified in
the upper pole right kidney, exophytic up under the liver where fat
plane between the liver capsule and the mass lesion is obscured.
Exophytic right renal mass does generate mass-effect on the inferior
aspect of the posterior right hepatic capsule. Capsular involvement
by tumor cannot be excluded. Mass extends deep in the kidney in
close proximity to the central sinus fat with some mass-effect in
the central sinus of the upper pole. Prominent venous anatomy is
identified in the perinephric fat around the right kidney. No
filling defect in the right renal vein to suggest tumor thrombus.
Right ureter unremarkable.

There is mild thickening noted anterior bladder wall.

Stomach/Bowel: Stomach is unremarkable. No gastric wall thickening.
No evidence of outlet obstruction. Duodenum is normally positioned
as is the ligament of Treitz. No small bowel wall thickening. No
small bowel dilatation. The terminal ileum is normal. The appendix
is normal. No gross colonic mass. No colonic wall thickening.

Vascular/Lymphatic: There is mild atherosclerotic calcification of
the abdominal aorta without aneurysm. There is no gastrohepatic or
hepatoduodenal ligament lymphadenopathy. No retroperitoneal or
mesenteric lymphadenopathy. No pelvic sidewall lymphadenopathy.

Reproductive: Prostate gland is enlarged.

Other: No intraperitoneal free fluid.

Musculoskeletal: No worrisome lytic or sclerotic osseous
abnormality.
IMPRESSION: 1. 8.4 x 7.2 x 5.9 cm heterogeneously enhancing mass in the upper
pole right kidney, exophytic up under the liver where fat plane
between the liver capsule and the mass lesion is obscured. Hepatic
capsular involvement by tumor cannot be excluded.
2. 3.2 x 2.6 cm left adrenal nodule does not meet criteria for
classification as adenoma based on absolute density or washout
characteristics. This is indeterminate. Metastatic disease not
excluded.
3. No lymphadenopathy in the abdomen or pelvis.
4. Status post left nephrectomy.
5. Prostatomegaly.
6. Aortic Atherosclerosis ([id number]).

## 2023-05-21 ENCOUNTER — Encounter: Payer: Self-pay | Admitting: Gastroenterology

## 2023-06-05 ENCOUNTER — Telehealth: Payer: Self-pay | Admitting: *Deleted

## 2023-06-05 NOTE — Telephone Encounter (Signed)
Attempt to reach pt for pre-visit. Busy signal.   Will attempt other number in profile  Second attempt to reach pt for pre-vist unsuccessful. LM with facility # for pt to call back. Instructed pt to call # given by end of the day and reschedule the pre-visit  with RN or the scheduled procedure will be canceled.

## 2023-06-06 ENCOUNTER — Telehealth: Payer: Self-pay | Admitting: *Deleted

## 2023-06-06 NOTE — Telephone Encounter (Signed)
Pt was scheduled for a 10:30. Am in person PV after 5 min after wait RN attempted stared # in profile. Unable to LM. Called second # in profile   Second attempt to reach pt for pre-vist unsuccessful. LM with facility # for pt to call back. Instructed pt to call # given by end of the day and reschedule the pre-visit  with RN or the scheduled procedure will be canceled.

## 2023-06-17 ENCOUNTER — Encounter: Payer: BC Managed Care – PPO | Admitting: Gastroenterology

## 2023-06-19 ENCOUNTER — Ambulatory Visit (AMBULATORY_SURGERY_CENTER): Payer: Medicaid Other

## 2023-06-19 VITALS — Ht 71.5 in | Wt 215.0 lb

## 2023-06-19 DIAGNOSIS — Z860100 Personal history of colon polyps, unspecified: Secondary | ICD-10-CM

## 2023-06-19 MED ORDER — NA SULFATE-K SULFATE-MG SULF 17.5-3.13-1.6 GM/177ML PO SOLN: 1.0000 | Freq: Once | ORAL | 0 refills | Status: AC

## 2023-06-19 NOTE — Progress Notes (Signed)

## 2023-07-01 ENCOUNTER — Telehealth: Payer: Self-pay | Admitting: Gastroenterology

## 2023-07-01 NOTE — Telephone Encounter (Signed)
Good morning, Dr. Myrtie Neither   I received a call from this patient wishing to reschedule his procedure for tomorrow 12/3 at 3:30 PM. Patient states [place]. Patient apologized for any inconvenience and rescheduled his procedure for 1/13 at 3:00 PM.   Thank you.

## 2023-07-02 ENCOUNTER — Encounter: Payer: BC Managed Care – PPO | Admitting: Gastroenterology

## 2023-07-17 ENCOUNTER — Encounter: Payer: BC Managed Care – PPO | Admitting: Gastroenterology

## 2023-08-07 ENCOUNTER — Telehealth: Payer: Self-pay | Admitting: Gastroenterology

## 2023-08-07 NOTE — Telephone Encounter (Signed)
 Attempted to reach pt to help with questions. Busy signal.

## 2023-08-07 NOTE — Telephone Encounter (Signed)
 Inbound call from patient stating that he is scheduled for a colonoscopy on 1/13 at 3:00 and is requesting a call to discuss prep. Please advise.

## 2023-08-11 ENCOUNTER — Encounter: Payer: Self-pay | Admitting: Certified Registered Nurse Anesthetist

## 2023-08-12 ENCOUNTER — Encounter: Payer: BC Managed Care – PPO | Admitting: Gastroenterology

## 2023-08-12 ENCOUNTER — Telehealth: Payer: Self-pay

## 2023-08-12 DIAGNOSIS — Z860100 Personal history of colon polyps, unspecified: Secondary | ICD-10-CM

## 2023-08-12 MED ORDER — NA SULFATE-K SULFATE-MG SULF 17.5-3.13-1.6 GM/177ML PO SOLN: 1.0000 | Freq: Once | ORAL | 0 refills | Status: AC

## 2023-08-12 NOTE — Telephone Encounter (Signed)
 Called and spoke with pt. Reviewed new appointment information with pt. New instructions sent to pt via MyChart. New prep prescription sent to pt's preferred pharmacy. RN offered to review instructions with pt, he states he understands instructions and has no questions at this time. Pt knows to continue to hold Mounjaro  until after the colonoscopy (last dose reported at 08/09/23). Instructed pt to call back with any questions. Pt verbalized understanding.   It appears that the primary number that was listed for pt contact is not working, pt informed that it goes to busy signal. Was able to contact pt on mobile number listed. Changed primary number to this number to ensure proper communication with pt.

## 2023-08-12 NOTE — Telephone Encounter (Signed)
 Patient phoned clinic this am to review colon prep instructions for today's colonoscopy. While going over the instructions it was discovered that patient did not hold  Mounjaro  injection for 7 days and took a dose last Friday 08/09/2023. Dr. Legrand made aware and approval received to reschedule patient for colonoscopy with another provider. New instructions reviewed with patient over phone and also send via My Chart.

## 2023-08-19 ENCOUNTER — Telehealth: Payer: Self-pay

## 2023-08-19 NOTE — Telephone Encounter (Signed)
A cologuard is not indicated for him because he has a history of colon polyps.  He can be rescheduled for a colonoscopy at a later date if he wishes to do so.  Whether or not it can be done by the end of this month will depend on schedule availability.  - H. Danis

## 2023-08-19 NOTE — Telephone Encounter (Signed)
Pt cancelled his colonoscopy scheduled for tomorrow 08/20/23 due to work-related travel.  Pt wants to do the Cologuard in lieu of the colonoscopy.  Pt stated he will reschedule his colonoscopy only if he does not qualify to do the Cologuard.  Pt wants to complete Cologuard or reschedule by the end of this month.

## 2023-08-20 ENCOUNTER — Encounter: Payer: Medicaid Other | Admitting: Internal Medicine
